# Patient Record
Sex: Female | Born: 1980 | Race: White | Hispanic: No | Marital: Single | Smoking: Current every day smoker
Health system: Southern US, Community
[De-identification: ages and names within clinical notes are randomized; demographics above are authoritative.]

## PROBLEM LIST (undated history)

## (undated) DIAGNOSIS — N83202 Unspecified ovarian cyst, left side: Secondary | ICD-10-CM

## (undated) DIAGNOSIS — N83201 Unspecified ovarian cyst, right side: Secondary | ICD-10-CM

## (undated) HISTORY — PX: KNEE ARTHROSCOPY: SHX127

---

## 2008-12-29 ENCOUNTER — Emergency Department (HOSPITAL_COMMUNITY): Admission: EM | Admit: 2008-12-29 | Discharge: 2008-12-29 | Payer: Self-pay | Admitting: Emergency Medicine

## 2009-03-11 ENCOUNTER — Emergency Department (HOSPITAL_COMMUNITY): Admission: EM | Admit: 2009-03-11 | Discharge: 2009-03-11 | Payer: Self-pay | Admitting: Emergency Medicine

## 2010-12-07 LAB — POCT URINALYSIS DIP (DEVICE)
Glucose, UA: NEGATIVE mg/dL
Ketones, ur: NEGATIVE mg/dL
Nitrite: NEGATIVE

## 2010-12-07 LAB — GC/CHLAMYDIA PROBE AMP, GENITAL
Chlamydia, DNA Probe: NEGATIVE
GC Probe Amp, Genital: NEGATIVE

## 2010-12-07 LAB — WET PREP, GENITAL
Trich, Wet Prep: NONE SEEN
Yeast Wet Prep HPF POC: NONE SEEN

## 2011-06-16 ENCOUNTER — Encounter: Payer: Self-pay | Admitting: Family Medicine

## 2011-06-16 ENCOUNTER — Emergency Department (HOSPITAL_BASED_OUTPATIENT_CLINIC_OR_DEPARTMENT_OTHER)
Admission: EM | Admit: 2011-06-16 | Discharge: 2011-06-16 | Disposition: A | Discharge status: Home / Self Care | Payer: Self-pay | Attending: Emergency Medicine | Admitting: Emergency Medicine

## 2011-06-16 DIAGNOSIS — N72 Inflammatory disease of cervix uteri: Secondary | ICD-10-CM | POA: Insufficient documentation

## 2011-06-16 HISTORY — DX: Unspecified ovarian cyst, right side: N83.201

## 2011-06-16 HISTORY — DX: Unspecified ovarian cyst, left side: N83.202

## 2011-06-16 LAB — URINALYSIS, ROUTINE W REFLEX MICROSCOPIC
Bilirubin Urine: NEGATIVE
Nitrite: NEGATIVE
Specific Gravity, Urine: 1.011 (ref 1.005–1.030)
pH: 5.5 (ref 5.0–8.0)

## 2011-06-16 LAB — URINE MICROSCOPIC-ADD ON

## 2011-06-16 LAB — WET PREP, GENITAL
Trich, Wet Prep: NONE SEEN
Yeast Wet Prep HPF POC: NONE SEEN

## 2011-06-16 MED ORDER — DOXYCYCLINE HYCLATE 100 MG PO CAPS: 100.0000 mg | ORAL_CAPSULE | Freq: Two times a day (BID) | ORAL | Status: AC

## 2011-06-16 MED ORDER — OXYCODONE-ACETAMINOPHEN 5-325 MG PO TABS
1.0000 | ORAL_TABLET | Freq: Once | ORAL | Status: AC
  Administered 2011-06-16: 1 via ORAL
  Filled 2011-06-16: qty 1

## 2011-06-16 MED ORDER — OXYCODONE-ACETAMINOPHEN 5-325 MG PO TABS: 1.0000 | ORAL_TABLET | Freq: Four times a day (QID) | ORAL | Status: AC | PRN

## 2011-06-16 MED ORDER — NAPROXEN 500 MG PO TABS: 500.0000 mg | ORAL_TABLET | Freq: Two times a day (BID) | ORAL | Status: AC

## 2011-06-16 MED ORDER — LIDOCAINE HCL (PF) 1 % IJ SOLN
INTRAMUSCULAR | Status: AC
  Administered 2011-06-16: 1 mL
  Filled 2011-06-16: qty 5

## 2011-06-16 MED ORDER — CEFTRIAXONE SODIUM 250 MG IJ SOLR
250.0000 mg | Freq: Once | INTRAMUSCULAR | Status: AC
  Administered 2011-06-16: 250 mg via INTRAMUSCULAR
  Filled 2011-06-16: qty 250

## 2011-06-16 NOTE — ED Notes (Signed)
Pt c/o suprapubic pain and urinary frequency x 3 days. Pt denies fever, n/v/d,

## 2011-06-16 NOTE — ED Provider Notes (Signed)
History     CSN: [id number] Arrival date & time: 06/16/2011  6:18 PM   First MD Initiated Contact with Patient 06/16/11 1813      Chief Complaint  Patient presents with  . Pelvic Pain    (Consider location/radiation/quality/duration/timing/severity/associated sxs/prior treatment) Patient is a 30 y.o. female presenting with pelvic pain. The history is provided by the patient.  Pelvic Pain This is a new problem. The current episode started more than 2 days ago. The problem occurs constantly. The problem has not changed since onset.Associated symptoms include abdominal pain. Pertinent negatives include no chest pain, no headaches and no shortness of breath. The symptoms are aggravated by walking. She has tried nothing for the symptoms.   patient states her last menstrual period was couple weeks ago.  She started having irregular bleeding today. Associated with that she's had suprapubic pain. Patient denies any vaginal discharge. She states her menstrual bleeding is a slight spotting like the end of her period. Patient states she's had similar symptoms in the past when she had an ovarian cyst. The pain is sharp in nature and described as moderate  Past Medical History  Diagnosis Date  . Bilateral ovarian cysts     Past Surgical History  Procedure Date  . Knee arthroscopy     No family history on file.  History  Substance Use Topics  . Smoking status: Current Everyday Smoker  . Smokeless tobacco: Not on file  . Alcohol Use: Yes    OB History    Grav Para Term Preterm Abortions TAB SAB Ect Mult Living                  Review of Systems  Respiratory: Negative for shortness of breath.   Cardiovascular: Negative for chest pain.  Gastrointestinal: Positive for abdominal pain.  Genitourinary: Positive for pelvic pain.  Neurological: Negative for headaches.  All other systems reviewed and are negative.    Allergies  Review of patient's allergies indicates no known  allergies.  Home Medications  No current outpatient prescriptions on file.  BP 115/82  Pulse 94  Temp(Src) 98 F (36.7 C) (Oral)  Resp 16  Ht 5\' 8"  (1.727 m)  Wt 130 lb (58.968 kg)  BMI 19.77 kg/m2  SpO2 100%  LMP 05/31/2011  Physical Exam  Nursing note and vitals reviewed. Constitutional: She appears well-developed and well-nourished. No distress.  HENT:  Head: Normocephalic and atraumatic.  Right Ear: External ear normal.  Left Ear: External ear normal.  Eyes: Conjunctivae are normal. Right eye exhibits no discharge. Left eye exhibits no discharge. No scleral icterus.  Neck: Neck supple. No tracheal deviation present.  Cardiovascular: Normal rate, regular rhythm and intact distal pulses.   Pulmonary/Chest: Effort normal and breath sounds normal. No stridor. No respiratory distress. She has no wheezes. She has no rales.  Abdominal: Soft. Bowel sounds are normal. She exhibits no distension. There is no tenderness. There is no rebound and no guarding.  Genitourinary: Pelvic exam was performed with patient supine. There is no rash, tenderness or lesion on the right labia. There is no rash, tenderness or lesion on the left labia. Uterus is tender. Cervix exhibits motion tenderness. Cervix exhibits no discharge. Right adnexum displays tenderness. Right adnexum displays no mass and no fullness. Left adnexum displays no mass, no tenderness and no fullness. There is bleeding around the vagina. No tenderness around the vagina. No signs of injury around the vagina.  Musculoskeletal: She exhibits no edema and no tenderness.  Neurological: She is alert. She has normal strength. No sensory deficit. Cranial nerve deficit:  no gross defecits noted. She exhibits normal muscle tone. She displays no seizure activity. Coordination normal.  Skin: Skin is warm and dry. No rash noted.  Psychiatric: She has a normal mood and affect.    ED Course  Procedures (including critical care time) Injection of  ceftriaxone Labs Reviewed  URINALYSIS, ROUTINE W REFLEX MICROSCOPIC - Abnormal; Notable for the following:    Hgb urine dipstick SMALL (*)    All other components within normal limits  URINE MICROSCOPIC-ADD ON - Abnormal; Notable for the following:    Squamous Epithelial / LPF FEW (*)    All other components within normal limits  WET PREP, GENITAL - Abnormal; Notable for the following:    Clue Cells, Wet Prep FEW (*)    WBC, Wet Prep HPF POC FEW (*)    All other components within normal limits  PREGNANCY, URINE  GC/CHLAMYDIA PROBE AMP, GENITAL  RPR   No results found.   1. Cervicitis       MDM  Patient without signs of urinary tract infection. Based on her symptoms and pelvic exam I suspect she has cases cervicitis. I doubt ovarian torsion or tubo-ovarian abscess. Patient will be prescribed a course of antibiotics and encourage her to followup with a gynecologist.        Celene Kras, MD 06/16/11 [phone]

## 2011-06-16 NOTE — ED Notes (Signed)
Pelvic cart set up at bedside  

## 2016-12-26 ENCOUNTER — Emergency Department: Payer: Self-pay

## 2016-12-26 ENCOUNTER — Emergency Department
Admission: EM | Admit: 2016-12-26 | Discharge: 2016-12-26 | Disposition: A | Discharge status: Home / Self Care | Payer: Self-pay | Attending: Student in an Organized Health Care Education/Training Program | Admitting: Student in an Organized Health Care Education/Training Program

## 2016-12-26 ENCOUNTER — Encounter: Payer: Self-pay | Admitting: Emergency Medicine

## 2016-12-26 DIAGNOSIS — F1010 Alcohol abuse, uncomplicated: Secondary | ICD-10-CM | POA: Insufficient documentation

## 2016-12-26 DIAGNOSIS — F17200 Nicotine dependence, unspecified, uncomplicated: Secondary | ICD-10-CM | POA: Insufficient documentation

## 2016-12-26 DIAGNOSIS — R112 Nausea with vomiting, unspecified: Secondary | ICD-10-CM

## 2016-12-26 DIAGNOSIS — R935 Abnormal findings on diagnostic imaging of other abdominal regions, including retroperitoneum: Secondary | ICD-10-CM | POA: Insufficient documentation

## 2016-12-26 DIAGNOSIS — R748 Abnormal levels of other serum enzymes: Secondary | ICD-10-CM

## 2016-12-26 DIAGNOSIS — R1013 Epigastric pain: Secondary | ICD-10-CM

## 2016-12-26 LAB — COMPREHENSIVE METABOLIC PANEL
ALT: 96 U/L — AB (ref 14–54)
AST: 429 U/L — AB (ref 15–41)
Albumin: 3.4 g/dL — ABNORMAL LOW (ref 3.5–5.0)
Alkaline Phosphatase: 181 U/L — ABNORMAL HIGH (ref 38–126)
Anion gap: 11 (ref 5–15)
BILIRUBIN TOTAL: 1.3 mg/dL — AB (ref 0.3–1.2)
BUN: 6 mg/dL (ref 6–20)
CO2: 27 mmol/L (ref 22–32)
CREATININE: 0.49 mg/dL (ref 0.44–1.00)
Calcium: 9.1 mg/dL (ref 8.9–10.3)
Chloride: 104 mmol/L (ref 101–111)
Glucose, Bld: 106 mg/dL — ABNORMAL HIGH (ref 65–99)
Potassium: 4.1 mmol/L (ref 3.5–5.1)
Sodium: 142 mmol/L (ref 135–145)
TOTAL PROTEIN: 6.5 g/dL (ref 6.5–8.1)

## 2016-12-26 LAB — CBC
HCT: 45.2 % (ref 35.0–47.0)
Hemoglobin: 15.2 g/dL (ref 12.0–16.0)
MCH: 34.9 pg — ABNORMAL HIGH (ref 26.0–34.0)
MCHC: 33.5 g/dL (ref 32.0–36.0)
MCV: 104.2 fL — ABNORMAL HIGH (ref 80.0–100.0)
PLATELETS: 172 10*3/uL (ref 150–440)
RBC: 4.34 MIL/uL (ref 3.80–5.20)
RDW: 15 % — AB (ref 11.5–14.5)
WBC: 7.4 10*3/uL (ref 3.6–11.0)

## 2016-12-26 LAB — URINALYSIS, COMPLETE (UACMP) WITH MICROSCOPIC
BILIRUBIN URINE: NEGATIVE
Bacteria, UA: NONE SEEN
GLUCOSE, UA: NEGATIVE mg/dL
HGB URINE DIPSTICK: NEGATIVE
Ketones, ur: NEGATIVE mg/dL
LEUKOCYTES UA: NEGATIVE
NITRITE: NEGATIVE
PH: 8 (ref 5.0–8.0)
Protein, ur: 100 mg/dL — AB
RBC / HPF: NONE SEEN RBC/hpf (ref 0–5)
SPECIFIC GRAVITY, URINE: 1.019 (ref 1.005–1.030)

## 2016-12-26 LAB — LIPASE, BLOOD: Lipase: 33 U/L (ref 11–51)

## 2016-12-26 LAB — POCT PREGNANCY, URINE: Preg Test, Ur: NEGATIVE

## 2016-12-26 MED ORDER — LORAZEPAM 1 MG PO TABS
1.0000 mg | ORAL_TABLET | Freq: Once | ORAL | Status: AC
  Administered 2016-12-26: 1 mg via ORAL

## 2016-12-26 MED ORDER — PROMETHAZINE HCL 12.5 MG PO TABS: 12.5000 mg | ORAL_TABLET | Freq: Four times a day (QID) | ORAL | 0 refills | Status: AC | PRN

## 2016-12-26 MED ORDER — LORAZEPAM 1 MG PO TABS
ORAL_TABLET | ORAL | Status: AC
  Filled 2016-12-26: qty 1

## 2016-12-26 MED ORDER — CHLORDIAZEPOXIDE HCL 25 MG PO CAPS
50.0000 mg | ORAL_CAPSULE | Freq: Once | ORAL | Status: AC
  Administered 2016-12-26: 50 mg via ORAL

## 2016-12-26 MED ORDER — CHLORDIAZEPOXIDE HCL 25 MG PO CAPS
ORAL_CAPSULE | ORAL | Status: AC
  Filled 2016-12-26: qty 2

## 2016-12-26 MED ORDER — PROMETHAZINE HCL 25 MG/ML IJ SOLN
INTRAMUSCULAR | Status: AC
  Filled 2016-12-26: qty 1

## 2016-12-26 MED ORDER — CHLORDIAZEPOXIDE HCL 25 MG PO CAPS: 25.0000 mg | ORAL_CAPSULE | Freq: Three times a day (TID) | ORAL | 0 refills | Status: AC | PRN

## 2016-12-26 MED ORDER — SODIUM CHLORIDE 0.9 % IV BOLUS (SEPSIS)
1000.0000 mL | Freq: Once | INTRAVENOUS | Status: AC
  Administered 2016-12-26: 1000 mL via INTRAVENOUS

## 2016-12-26 MED ORDER — PROMETHAZINE HCL 25 MG/ML IJ SOLN
12.5000 mg | Freq: Once | INTRAMUSCULAR | Status: AC
  Administered 2016-12-26: 12.5 mg via INTRAVENOUS
  Filled 2016-12-26: qty 1

## 2016-12-26 NOTE — ED Provider Notes (Signed)
Tracy David    First MD Initiated Contact with Patient 12/26/16 1424     (approximate)  I have reviewed the triage vital signs and the nursing notes.   HISTORY  Chief Complaint Nausea and Emesis    HPI Tracy David is a 36 y.o. female presents with several days of nausea vomiting and epigastric "fullness" and gnawing pain. Patient states that she is feeling jittery and anxious. States that she has been coughing a lot and is having some chest discomfort and epigastric area. No fevers. Has been having trouble keeping anything on her stomach due to nausea. Is also having loose stools. Patient states that she drinks roughly 2 four-loco beverages a day. Last drink was last night. Denies any history of hepatitis.   Past Medical History:  Diagnosis Date  . Bilateral ovarian cysts    No family history on file. Past Surgical History:  Procedure Laterality Date  . KNEE ARTHROSCOPY     There are no active problems to display for this patient.     Prior to Admission medications   Not on File    Allergies Patient has no known allergies.    Social History Social History  Substance Use Topics  . Smoking status: Current Every Day Smoker  . Smokeless tobacco: Never Used  . Alcohol use Yes    Review of Systems Patient denies headaches, rhinorrhea, blurry vision, numbness, shortness of breath, chest pain, edema, cough, abdominal pain, nausea, vomiting, diarrhea, dysuria, fevers, rashes or hallucinations unless otherwise stated above in HPI. ____________________________________________   PHYSICAL EXAM:  VITAL SIGNS: Vitals:   12/26/16 1321  BP: (!) 135/98  Pulse: 100  Resp: 20  Temp: 98.5 F (36.9 C)    Constitutional: Alert and oriented. Well appearing and in no acute distress. Eyes: Conjunctivae are normal. PERRL. EOMI. Head: Atraumatic. Nose: No congestion/rhinnorhea. Mouth/Throat: Mucous membranes  are moist.  Oropharynx non-erythematous. Neck: No stridor. Painless ROM. No cervical spine tenderness to palpation Hematological/Lymphatic/Immunilogical: No cervical lymphadenopathy. Cardiovascular: Normal rate, regular rhythm. Grossly normal heart sounds.  Good peripheral circulation. Respiratory: Normal respiratory effort.  No retractions. Lungs CTAB. Gastrointestinal: Soft with mild RUQ ttp and discomfort. No distention. No abdominal bruits. No CVA tenderness. Musculoskeletal: No lower extremity tenderness nor edema.  No joint effusions. Neurologic:  Normal speech and language. No gross focal neurologic deficits are appreciated. No gait instability.   Fainting resting tremor to BUE and faint tongue fasciculations Skin:  Skin is warm, dry and intact. No rash noted. Psychiatric: Mood and affect are normal. Speech and behavior are normal.  ____________________________________________   LABS (all labs ordered are listed, but only abnormal results are displayed)  Results for orders placed or performed during the hospital encounter of 12/26/16 (from the past 24 hour(s))  Lipase, blood     Status: None   Collection Time: 12/26/16  1:26 PM  Result Value Ref Range   Lipase 33 11 - 51 U/L  Comprehensive metabolic panel     Status: Abnormal   Collection Time: 12/26/16  1:26 PM  Result Value Ref Range   Sodium 142 135 - 145 mmol/L   Potassium 4.1 3.5 - 5.1 mmol/L   Chloride 104 101 - 111 mmol/L   CO2 27 22 - 32 mmol/L   Glucose, Bld 106 (H) 65 - 99 mg/dL   BUN 6 6 - 20 mg/dL   Creatinine, Ser 4.09 0.44 - 1.00 mg/dL   Calcium 9.1 8.9 - 81.1 mg/dL  Total Protein 6.5 6.5 - 8.1 g/dL   Albumin 3.4 (L) 3.5 - 5.0 g/dL   AST 161 (H) 15 - 41 U/L   ALT 96 (H) 14 - 54 U/L   Alkaline Phosphatase 181 (H) 38 - 126 U/L   Total Bilirubin 1.3 (H) 0.3 - 1.2 mg/dL   GFR calc non Af Amer >60 >60 mL/min   GFR calc Af Amer >60 >60 mL/min   Anion gap 11 5 - 15  CBC     Status: Abnormal   Collection Time:  12/26/16  1:26 PM  Result Value Ref Range   WBC 7.4 3.6 - 11.0 K/uL   RBC 4.34 3.80 - 5.20 MIL/uL   Hemoglobin 15.2 12.0 - 16.0 g/dL   HCT 09.6 04.5 - 40.9 %   MCV 104.2 (H) 80.0 - 100.0 fL   MCH 34.9 (H) 26.0 - 34.0 pg   MCHC 33.5 32.0 - 36.0 g/dL   RDW 81.1 (H) 91.4 - 78.2 %   Platelets 172 150 - 440 K/uL  Urinalysis, Complete w Microscopic     Status: Abnormal   Collection Time: 12/26/16  1:26 PM  Result Value Ref Range   Color, Urine AMBER (A) YELLOW   APPearance HAZY (A) CLEAR   Specific Gravity, Urine 1.019 1.005 - 1.030   pH 8.0 5.0 - 8.0   Glucose, UA NEGATIVE NEGATIVE mg/dL   Hgb urine dipstick NEGATIVE NEGATIVE   Bilirubin Urine NEGATIVE NEGATIVE   Ketones, ur NEGATIVE NEGATIVE mg/dL   Protein, ur 956 (A) NEGATIVE mg/dL   Nitrite NEGATIVE NEGATIVE   Leukocytes, UA NEGATIVE NEGATIVE   RBC / HPF NONE SEEN 0 - 5 RBC/hpf   WBC, UA 0-5 0 - 5 WBC/hpf   Bacteria, UA NONE SEEN NONE SEEN   Squamous Epithelial / LPF 0-5 (A) NONE SEEN   Mucous PRESENT   Pregnancy, urine POC     Status: None   Collection Time: 12/26/16  1:31 PM  Result Value Ref Range   Preg Test, Ur NEGATIVE NEGATIVE   ____________________________________________   ____________________________________________  RADIOLOGY  I personally reviewed all radiographic images ordered to evaluate for the above acute complaints and reviewed radiology reports and findings.  These findings were personally discussed with the patient.  Please see medical record for radiology report.  ____________________________________________   PROCEDURES  Procedure(s) performed:  Procedures    Critical Care performed: no ____________________________________________   INITIAL IMPRESSION / ASSESSMENT AND PLAN / ED COURSE  Pertinent labs & imaging results that were available during my care of the patient were reviewed by me and considered in my medical decision making (see chart for details).  DDX: gastritis, hepatitis,  pna, enteritis, withdrawal  Tracy David is a 36 y.o. who presents to the ED with epigastric discomfort and nausea and vomiting as described above. She is febrile and otherwise well-appearing. Has no respiratory distress. Blood work is suggestive of alcoholic hepatitis. Based on her acute right upper quadrant pain will order ultrasound to further characterize any biliary pathology. She is not jaundiced. We'll check chest x-ray to evaluate for any associated effusion or pneumonia. We'll provide symptomatically relief. Does not appear to be in any active withdrawal but does have some faint tremors therefore will give Ativan and Librium as she last drank yesterday evening.  Clinical Course as of Dec 27 2350  Mon Dec 26, 2016  1737 Discussed results of [place] with patient.  States symptoms resolved and requesting something to eat.  Repeat abd e  [  PR]  1737 Discussed results of [place] with patient.  States symptoms resolved and requesting something to eat.  Repeat abd exam benign.  Will po challenge  [PR]  1829 Patient has tolerated oral challenge and is requesting discharge home. Has a repeat abdominal exam is benign and she is ambulatory with no unsteady gait and do not feel that MRCP is emergently indicated.  Discussed signs and symptoms for which she should immediately return to the hospital.  Patient was able to tolerate PO and was able to ambulate with a steady gait.  Have discussed with the patient and available family all diagnostics and treatments performed thus far and all questions were answered to the best of my ability. The patient demonstrates understanding and agreement with plan.   [PR]    Clinical Course User Index [PR] Willy Eddy, MD     ____________________________________________   FINAL CLINICAL IMPRESSION(S) / ED DIAGNOSES  Final diagnoses:  Nausea and vomiting  Epigastric pain  Alcohol abuse  Elevated liver enzymes  Abnormal [place] (ultrasound) of abdomen      NEW  MEDICATIONS STARTED DURING THIS VISIT:  New Prescriptions   No medications on file     David:  This document was prepared using Dragon voice recognition software and may include unintentional dictation errors.    Willy Eddy, MD 12/26/16 [phone]

## 2016-12-26 NOTE — Discharge Instructions (Signed)

## 2016-12-26 NOTE — ED Triage Notes (Signed)
Pt reports N/V, "shaking" and chest soreness with cough.

## 2016-12-26 NOTE — ED Notes (Signed)
Pt eating crackers, and ale.  Iv dc'ed.

## 2017-05-18 IMAGING — CR DG CHEST 2V
1 series · 2 of 2 positions shown · non-contrast
Comparison: None.

CLINICAL DATA: Chest pain and cough for the past 3-4 days.  Smoker.

EXAM:
CHEST  2 VIEW

[Series 1: dg chest 2 view · 0.14mm/px · 2 of 2 slices shown]
[im 1/2]
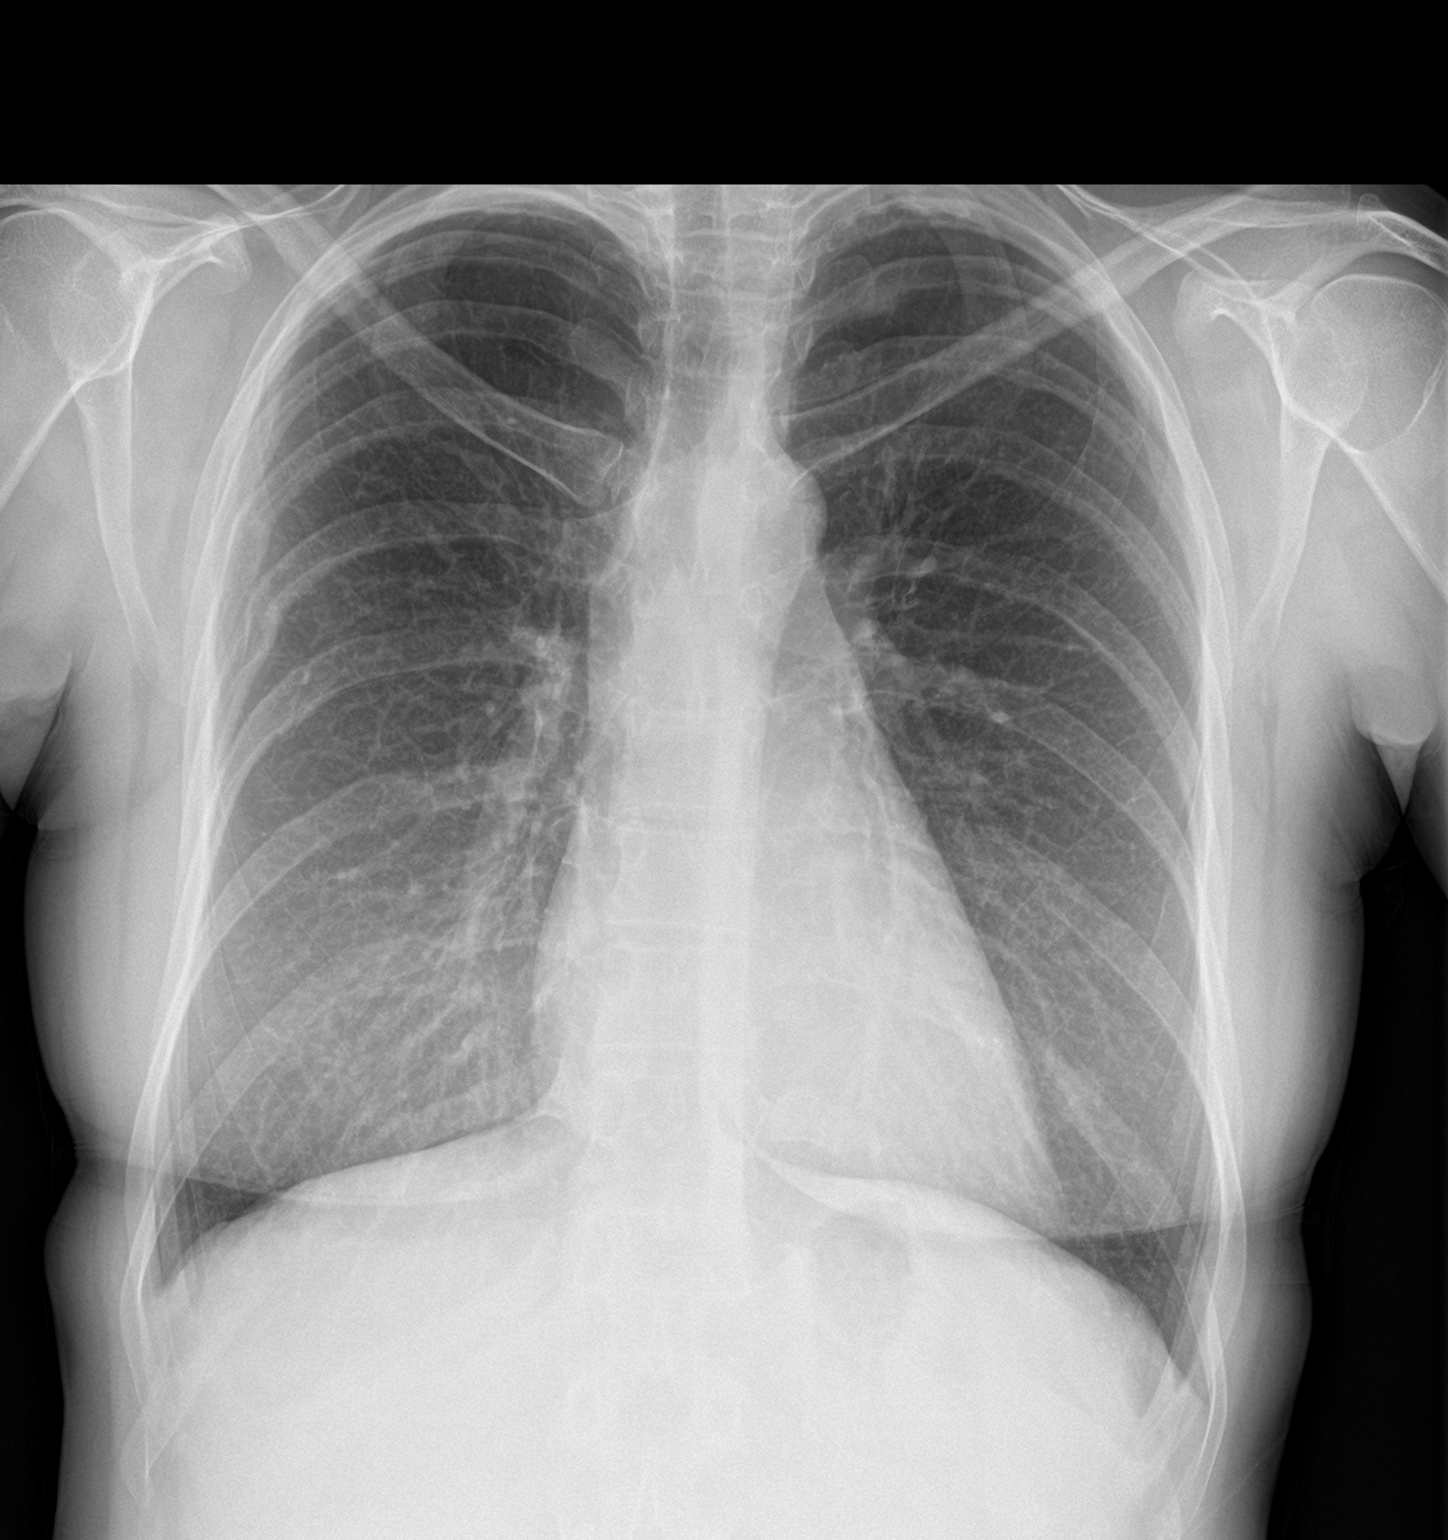
[im 2/2]
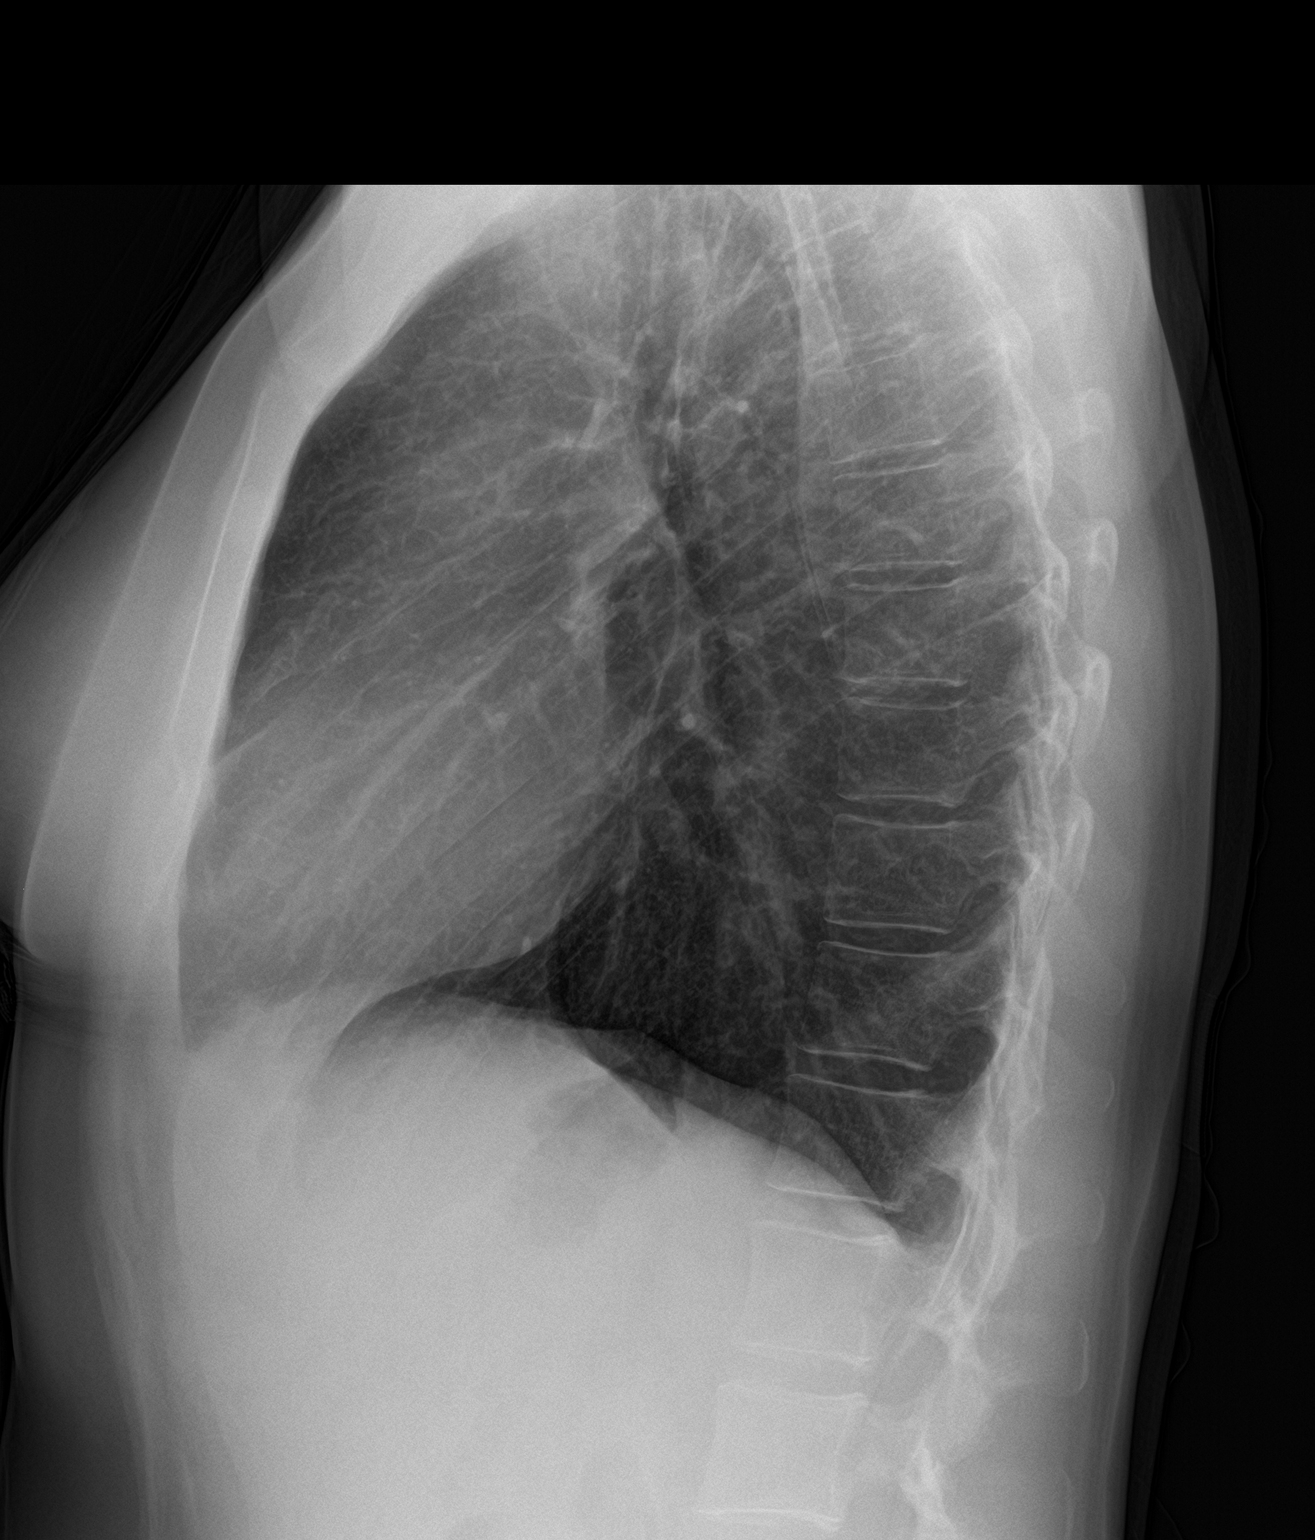

[2 of 2 positions shown; findings below may reference images not displayed]

FINDINGS: Normal sized heart. Clear lungs with normal vascularity. Minimal
diffuse peribronchial thickening and accentuation of the
interstitial markings. Old, healed right rib fractures.
IMPRESSION: Minimal chronic bronchitic changes.  No acute abnormality.

## 2017-09-26 IMAGING — US US ABDOMEN LIMITED
1 series · 14 of 25 positions shown · non-contrast
Comparison: None.

CLINICAL DATA: Nausea and vomiting x2 months

EXAM:
US ABDOMEN LIMITED - RIGHT UPPER QUADRANT

[Series 1: us abdomen limited · 0.22mm/px · 14 of 69 slices shown]
[im 1/69]
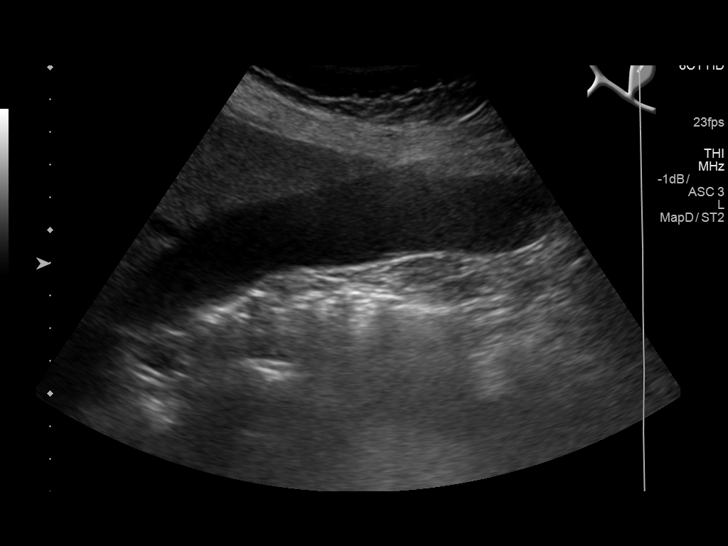
[im 6/69]
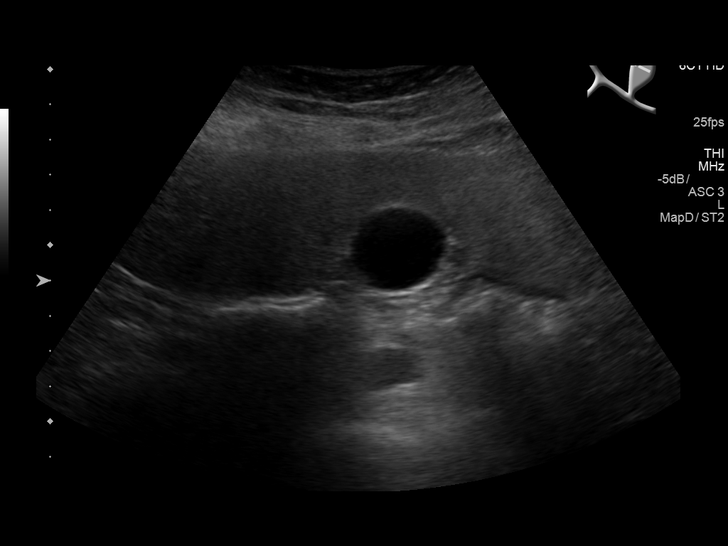
[im 12/69]
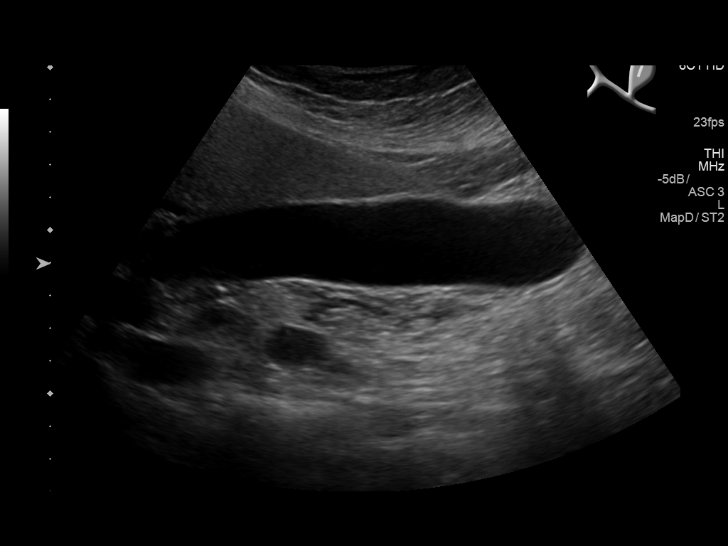
[im 18/69]
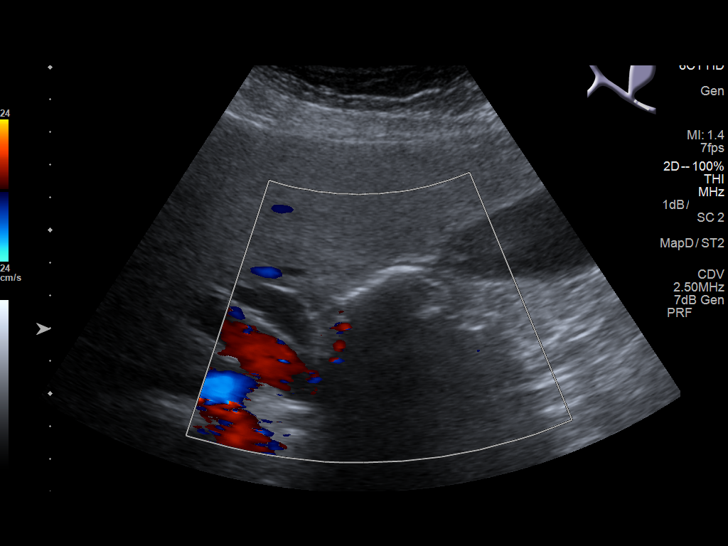
[im 23/69]
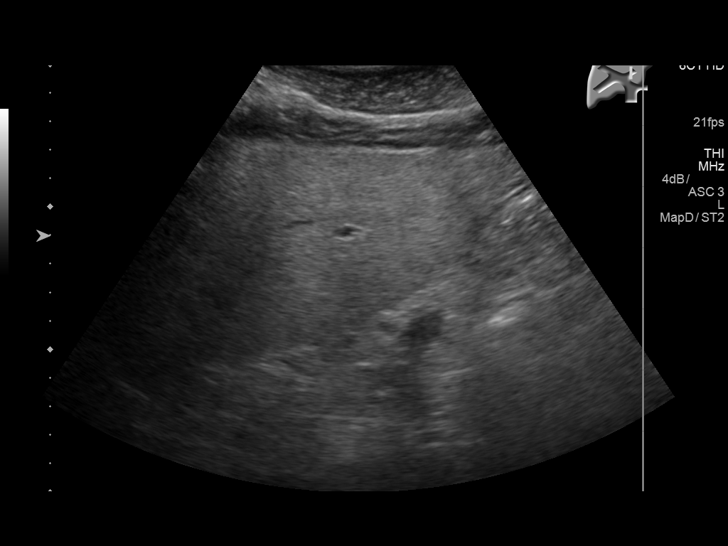
[im 26/69]
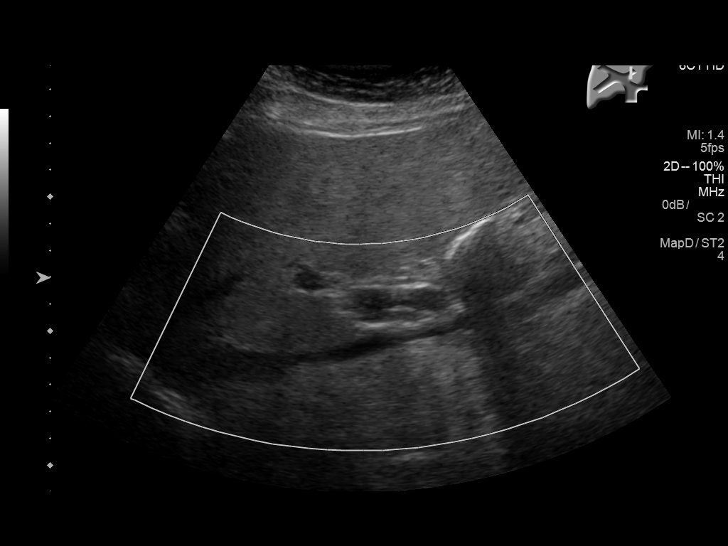
[im 32/69]
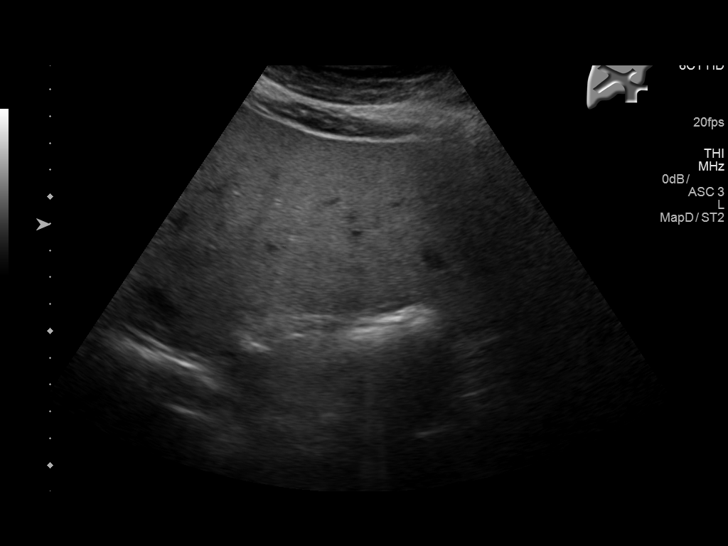
[im 37/69]
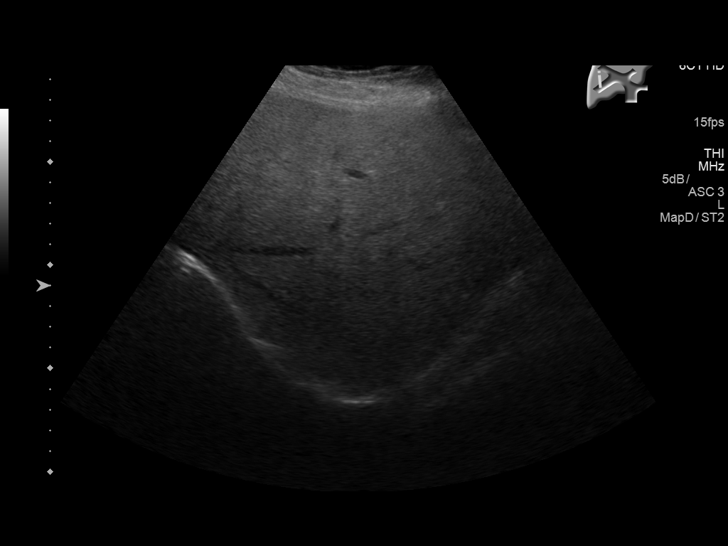
[im 43/69]
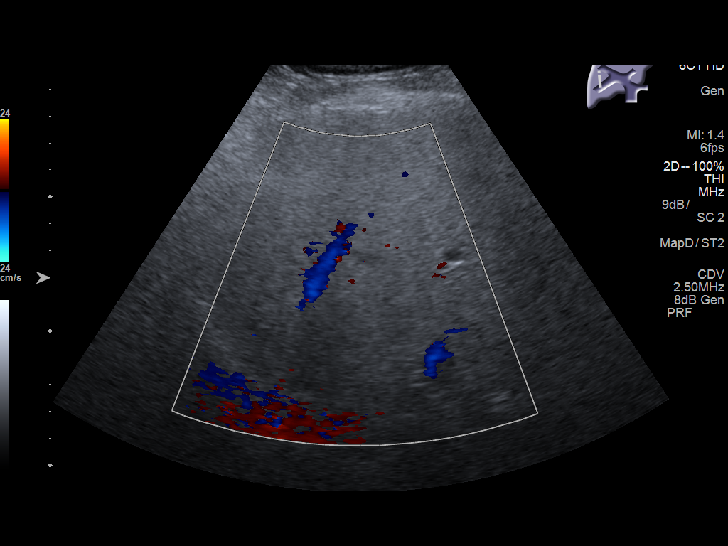
[im 46/69]
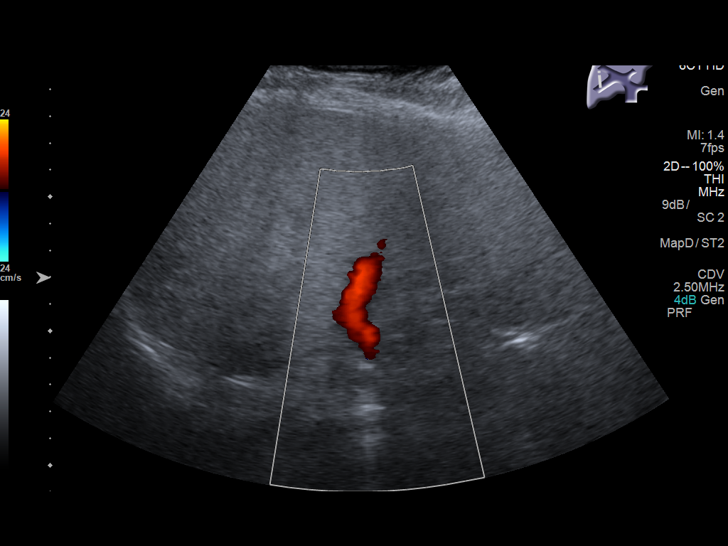
[im 52/69]
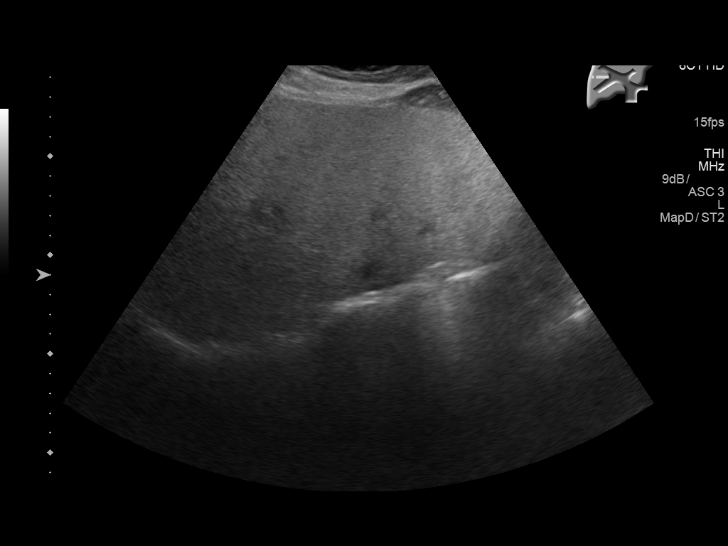
[im 57/69]
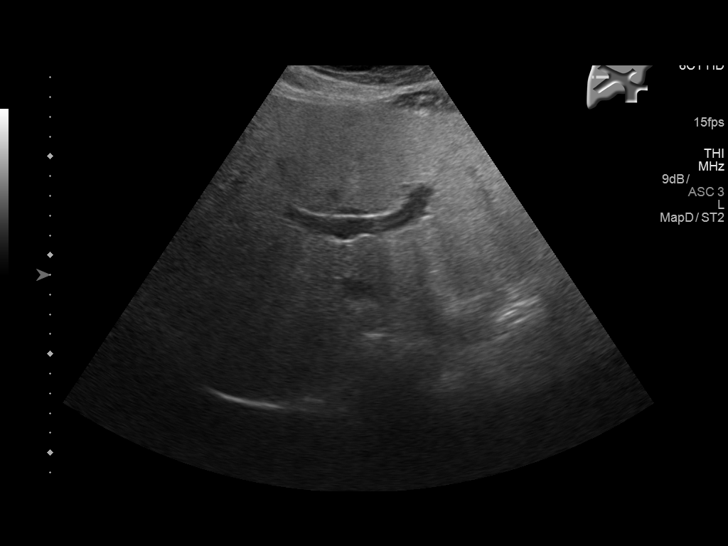
[im 63/69]
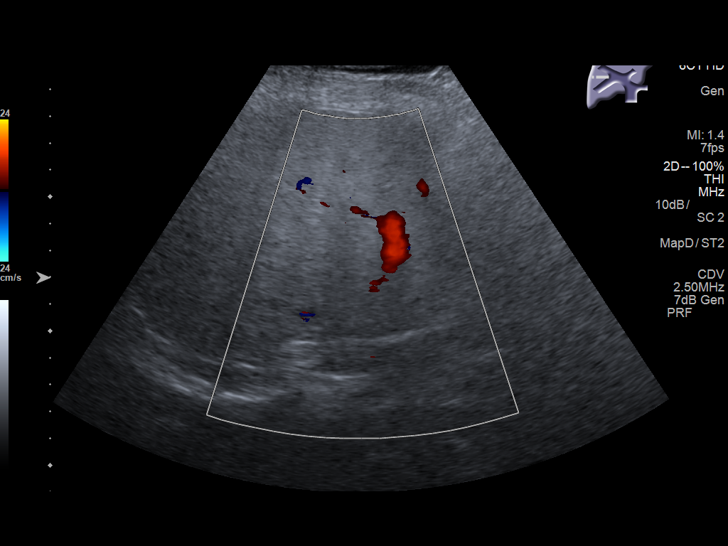
[im 69/69]
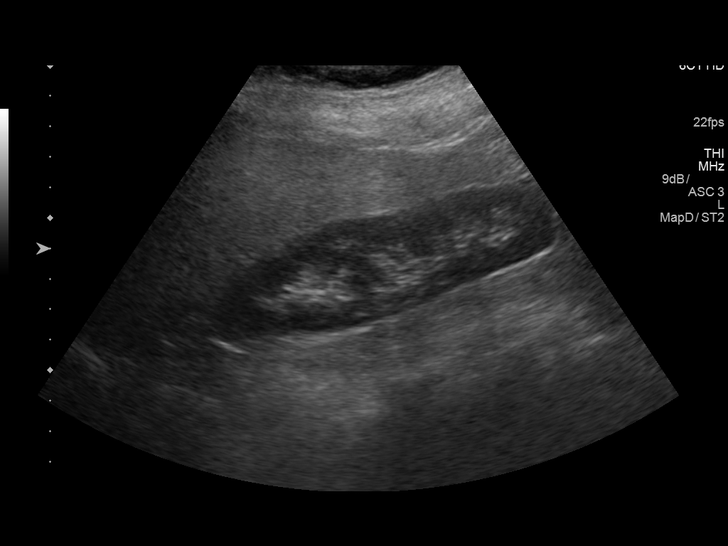

[14 of 25 positions shown; findings below may reference images not displayed]

FINDINGS: Gallbladder:

No gallstones or wall thickening visualized. Minimal biliary sludge
near the gallbladder neck of the gallbladder. No sonographic Murphy
sign noted by sonographer.

Common bile duct:

Diameter: Dilated to 9 mm without definite choledocholithiasis.

Liver:

No focal lesion identified. Increased echogenicity of the liver
parenchyma. No biliary dilatation.
IMPRESSION: 1. Echogenic liver consistent with hepatic steatosis.
2. Some biliary sludge is noted near the gallbladder neck.
3. Dilated appearance of the common bile duct up to 9 mm without
definite choledocholithiasis. MRCP may help for further correlation
as deemed clinically necessary.
